# Patient Record
Sex: Female | Born: 2005 | Race: White | Hispanic: No | Marital: Single | Smoking: Never smoker
Health system: Southern US, Community
[De-identification: ages and names within clinical notes are randomized; demographics above are authoritative.]

## PROBLEM LIST (undated history)

## (undated) DIAGNOSIS — F329 Major depressive disorder, single episode, unspecified: Secondary | ICD-10-CM

## (undated) DIAGNOSIS — F32A Depression, unspecified: Secondary | ICD-10-CM

## (undated) DIAGNOSIS — J45990 Exercise induced bronchospasm: Secondary | ICD-10-CM

## (undated) DIAGNOSIS — J029 Acute pharyngitis, unspecified: Secondary | ICD-10-CM

## (undated) HISTORY — DX: Exercise induced bronchospasm: J45.990

## (undated) HISTORY — DX: Depression, unspecified: F32.A

## (undated) HISTORY — DX: Major depressive disorder, single episode, unspecified: F32.9

---
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
## 2024-05-23 ENCOUNTER — Inpatient Hospital Stay
Admit: 2024-05-23 | Discharge: 2024-05-23 | Disposition: A | Discharge status: Home / Self Care | Payer: Medicaid (Managed Care) | Arrived: WI | Attending: Emergency Medicine

## 2024-05-23 MED ORDER — AMOXICILLIN-POT CLAVULANATE 875-125 MG PO TABS
875-125 | ORAL_TABLET | Freq: Two times a day (BID) | ORAL | 0 refills | 9.00000 days | Status: AC
Start: 2024-05-23 — End: 2024-06-02

## 2024-05-23 MED ORDER — DEXAMETHASONE SOD PHOSPHATE PF 10 MG/ML IJ SOLN
10 | Freq: Once | INTRAMUSCULAR | Status: AC
Start: 2024-05-23 — End: 2024-05-23
  Administered 2024-05-23: 15:00:00 10 mg via ORAL

## 2024-05-23 MED ORDER — ACETAMINOPHEN 500 MG PO TABS
500 | ORAL | Status: AC
Start: 2024-05-23 — End: 2024-05-23
  Administered 2024-05-23: 15:00:00 1000 mg via ORAL

## 2024-05-23 MED ORDER — IBUPROFEN 400 MG PO TABS
400 | ORAL | Status: DC
Start: 2024-05-23 — End: 2024-05-23

## 2024-05-23 MED ORDER — AMOXICILLIN-POT CLAVULANATE 875-125 MG PO TABS
875-125 | ORAL | Status: AC
Start: 2024-05-23 — End: 2024-05-23
  Administered 2024-05-23: 15:00:00 1 via ORAL

## 2024-05-23 MED FILL — ACETAMINOPHEN EXTRA STRENGTH 500 MG PO TABS: 500 mg | ORAL | Qty: 2 | Fill #0

## 2024-05-23 MED FILL — DEXAMETHASONE SOD PHOSPHATE PF 10 MG/ML IJ SOLN: 10 mg/mL | INTRAMUSCULAR | Qty: 1 | Fill #0

## 2024-05-23 MED FILL — AMOXICILLIN-POT CLAVULANATE 875-125 MG PO TABS: 875-125 mg | ORAL | Qty: 1 | Fill #0

## 2024-05-23 NOTE — Discharge Instructions (Addendum)
"  You were seen in the ER for your COVID/Flu-like symptoms. You should act as though you are positive even if you have had negative swabs. Thankfully, your vital signs are reassuring, including your oxygen and your blood pressure.     Because of your history of strep, we have given you an antibiotic to take for the next 10 days. We have also given you a steroid medication that reduces swelling and pain in your throat for the next 2 days. This medication works differently from tylenol /ibuprofen .    You should continue to take tylenol  or ibuprofen  for fever, aches and pains for the next few days. You can alternate these every 3 hours so that you always have something on board. For instance, you can take tylenol  at 9am, ibuprofen  at noon with lunch, tylenol  again at 3pm and ibuprofen  again at 6pm with dinner. Take in plenty of water to stay hydrated and follow up with your primary care doctor in the next few days. Return to the ER for any uncontrollable vomiting or diarrhea, difficulty breathing, or any other new or concerning symptoms.          Thank you for choosing our Emergency Department for your care.  It is our privilege to care for you in your time of need.  In the next several days, you may receive a survey via email or mailed to your home about your experience with our team.  We would greatly appreciate you taking a few minutes to complete the survey, as we use this information to learn what we have done well and what we could be doing better. Thank you for trusting us  with your care!    Below you will find a list of your tests from today's visit.   Labs and Radiology Studies  No results found for this or any previous visit (from the past 12 hours).  No results found.  ------------------------------------------------------------------------------------------------------------  The evaluation and treatment you received in the Emergency Department were for an urgent problem. It is important that you follow-up  with a doctor, nurse practitioner, or physician assistant to:  (1) confirm your diagnosis,  (2) re-evaluation of changes in your illness and treatment, and (3) for ongoing care. Please take your discharge instructions with you when you go to your follow-up appointment.     If you have any problem arranging a follow-up appointment, contact us !  If your symptoms become worse or you do not improve as expected, please return to us . We are available 24 hours a day.     If a prescription has been provided, please fill it as soon as possible to prevent a delay in treatment. If you have any questions or reservations about taking the medication due to side effects or interactions with other medications, please call your primary care provider or contact us  directly.  Again, THANK YOU for choosing us  to care for YOU!    "

## 2024-05-23 NOTE — ED Triage Notes (Signed)
"  Patient presents for complaint of cold symptoms, onset  05/21/2024, including fever, cough, congestion, chills, runny nose, sore throat.  Seen at urgent care yesterday for the same and tested negative for covid, flu, strep.  Was prescribed cough medication.  Took ibuprofen  around 0930 this am.  Afebrile in triage.   "

## 2024-05-23 NOTE — ED Provider Notes (Addendum)
 "[facility] EMERGENCY DEPT  EMERGENCY DEPARTMENT HISTORY AND PHYSICAL EXAM      Date: 05/23/2024  Patient Name: Cindy Aguirre  MRN: [id number]  Birthdate [birth year]  Date of evaluation: 05/23/2024  Provider: Teresa Bison, MD   Note Started: 10:03 AM EST 05/23/24    HISTORY OF PRESENT ILLNESS     Chief Complaint   Patient presents with    Cold Symptoms       History Provided By: patient, mother    HPI: Cindy Aguirre is a 19 y.o. female with PMH depression on sertraline presenting with cold sx. Pt endorses 2d of cough, bodyaches, sore throat, fevers. No N/V/D. Tested negative for strep, covid and flu at urgent care yesterday and got cough medicine. Mother states pt has a history of strep throat needing treatment.    PAST MEDICAL HISTORY   Past Medical History:  Past Medical History:   Diagnosis Date    Anxiety     Depression        Past Surgical History:  History reviewed. No pertinent surgical history.    Family History:  History reviewed. No pertinent family history.    Social History:  Social History     Tobacco Use    Smoking status: Never     Passive exposure: Never    Smokeless tobacco: Never   Vaping Use    Vaping status: Never Used   Substance Use Topics    Alcohol use: Never    Drug use: Never       Allergies:  No Known Allergies    PCP: Deloria Damien ORN, APRN - NP    Current Meds:   No current facility-administered medications for this encounter.     Current Outpatient Medications   Medication Sig Dispense Refill    amoxicillin -clavulanate (AUGMENTIN ) 875-125 MG per tablet Take 1 tablet by mouth 2 times daily for 10 days 20 tablet 0    Cetirizine HCl (ZYRTEC ALLERGY) 10 MG CAPS Take by mouth      norgestimate-ethinyl estradiol (ORTHO-CYCLEN) 0.25-35 MG-MCG per tablet Take 1 tablet by mouth daily      sertraline (ZOLOFT) 100 MG tablet Take by mouth daily         Social Determinants of Health:   Social Drivers of Health     Tobacco Use: Low Risk  (05/23/2024)    Patient History     Smoking Tobacco Use: Never     Smokeless  Tobacco Use: Never     Passive Exposure: Never   Alcohol Use: Not At Risk (05/23/2024)    AUDIT-C     Frequency of Alcohol Consumption: Never     Average Number of Drinks: Patient does not drink     Frequency of Binge Drinking: Never   Physicist, Medical Strain: Not on file   Food Insecurity: Not on file   Transportation Needs: Not on file   Physical Activity: Not on file   Stress: Not on file   Social Connections: Not on file   Intimate Partner Violence: Not on file   Depression: Not on file   Housing Stability: Not on file   Interpersonal Safety: Not At Risk (05/23/2024)    Interpersonal Safety Domain Source: IP Abuse Screening     Physical abuse: Denies     Verbal abuse: Denies     Emotional abuse: Denies     Financial abuse: Denies     Sexual abuse: Denies   Utilities: Not on file       PHYSICAL EXAM  Constitutional: Awake and alert, interactive, NAD  Eyes: PERRL, no injection or scleral icterus, no discharge  HEENT: NCAT, neck supple, MMM, no oropharyngeal exudates  CV: Tachycardic, RR, no m/r/g  Respiratory: CTAB, no r/r/w  GI: Abd soft, nondistended, nontender  GU: Deferred  MSK: FROM, no joint effusions or edema  Skin: No rashes  Neuro: CN2-12 intact, symmetric facies, fluent speech.  Psych: Well-groomed, normal speech, behavior, appropriate mood  Lymph: No LAD        SCREENINGS                   LAB, EKG AND DIAGNOSTIC RESULTS   Labs:  No results found for this or any previous visit (from the past 12 hours).      EKG interpretation by me:       Radiologic Studies:  Non-plain film images such as CT, Ultrasound and MRI are read by the radiologist. Plain radiographic images are visualized and preliminarily interpreted by the ED Provider with the following findings: (SEE ED COURSE)    Interpretation per the Radiologist below, if available at the time of this note:  No orders to display                    ED COURSE and DIFFERENTIAL DIAGNOSIS/MDM         10:03 AM Differential and Considerations: 1F w/likely viral  syndrome. Tachycardic but otherwise well-appearing and vitals reassuring. Will give tylenol  and decadron  for pain and swelling, s/p ibuprofen  PTA. Strep negative but given hx of strep could still be etiology, will cover w/augmentin  as requested by mother. Pt tolerating PO, will discharge w/strict return precautions.        Records Reviewed (source and summary of external notes): Prior medical records and Nursing notes.    Vitals:    Vitals:    05/23/24 0950 05/23/24 1002 05/23/24 1014   BP: 108/61     Pulse: (!) 126 (!) 118 (!) 113   Resp: 18     Temp: 98.2 F (36.8 C)     TempSrc: Oral     SpO2: 95%     Weight: 68 kg (150 lb)     Height: 1.626 m (5' 4)          ED COURSE       Patient was given the following medications:  Medications   acetaminophen  (TYLENOL ) tablet 1,000 mg (1,000 mg Oral Given 05/23/24 1012)   dexAMETHasone  (PF) (DECADRON ) Oral 10 mg (10 mg Oral Given 05/23/24 1012)   amoxicillin -clavulanate (AUGMENTIN ) 875-125 MG per tablet 1 tablet (1 tablet Oral Given 05/23/24 1012)       CONSULTS: See ED Course/MDM for further details.        PROCEDURES   Unless otherwise noted above, none      CRITICAL CARE TIME   N/a     ED IMPRESSION     1. Acute pharyngitis, unspecified etiology          DISPOSITION/PLAN   Discharge     PATIENT REFERRED TO:  Deloria Damien ORN, APRN - NP  [address]  [place] [address]  [phone]          [facility]  Emergency Department  [address]  [place]  [address]  [phone]    If symptoms worsen        DISCHARGE MEDICATIONS:     Medication List        START taking these medications  amoxicillin -clavulanate 875-125 MG per tablet  Commonly known as: AUGMENTIN   Take 1 tablet by mouth 2 times daily for 10 days            ASK your doctor about these medications      norgestimate-ethinyl estradiol 0.25-35 MG-MCG per tablet  Commonly known as: ORTHO-CYCLEN     sertraline 100 MG tablet  Commonly known as: ZOLOFT     ZyrTEC Allergy 10 MG Caps  Generic drug:  Cetirizine HCl               Where to Get Your Medications        These medications were sent to [facility] [address] [place] - [address] - P [phone] [facility]  [address] [place], [place] [address]      Phone: [phone]   amoxicillin -clavulanate 875-125 MG per tablet           DISCONTINUED MEDICATIONS:  Current Discharge Medication List          I am the Primary Clinician of Record. Teresa Bison, MD (electronically signed)    (Please note that parts of this dictation were completed with voice recognition software. Quite often unanticipated grammatical, syntax, homophones, and other interpretive errors are inadvertently transcribed by the computer software. Please disregards these errors. Please excuse any errors that have escaped final proofreading.)     Corbin Falck, MD  05/23/24 1005       Bison Teresa, MD  05/23/24 1015    "
